# Patient Record
Sex: Male | Born: 1975 | Race: White | Hispanic: No | Marital: Married | Smoking: Never smoker
Health system: Southern US, Community
[De-identification: ages and names within clinical notes are randomized; demographics above are authoritative.]

## PROBLEM LIST (undated history)

## (undated) DIAGNOSIS — E785 Hyperlipidemia, unspecified: Secondary | ICD-10-CM

---

## 2009-05-28 ENCOUNTER — Ambulatory Visit (HOSPITAL_BASED_OUTPATIENT_CLINIC_OR_DEPARTMENT_OTHER): Admission: RE | Admit: 2009-05-28 | Discharge: 2009-05-28 | Payer: Self-pay | Admitting: Family Medicine

## 2009-05-28 ENCOUNTER — Ambulatory Visit: Payer: Self-pay | Admitting: Diagnostic Radiology

## 2009-05-28 ENCOUNTER — Ambulatory Visit: Payer: Self-pay | Admitting: Family Medicine

## 2009-05-28 DIAGNOSIS — E785 Hyperlipidemia, unspecified: Secondary | ICD-10-CM

## 2009-05-28 DIAGNOSIS — N209 Urinary calculus, unspecified: Secondary | ICD-10-CM

## 2009-05-28 DIAGNOSIS — R319 Hematuria, unspecified: Secondary | ICD-10-CM

## 2009-05-28 LAB — CONVERTED CEMR LAB
Bilirubin Urine: NEGATIVE
Glucose, Urine, Semiquant: NEGATIVE

## 2009-05-29 ENCOUNTER — Encounter: Payer: Self-pay | Admitting: Family Medicine

## 2009-08-25 ENCOUNTER — Ambulatory Visit: Payer: Self-pay | Admitting: Occupational Medicine

## 2010-09-01 NOTE — Assessment & Plan Note (Signed)
Summary: BACK PAIN,KIDNEY STONE/TJ   Vital Signs:  Patient Profile:   35 Years Old Male CC:      Back Pain Height:     71 inches Weight:      276 pounds O2 Sat:      100 % O2 treatment:    Room Air Temp:     98.1 degrees F oral Pulse rate:   84 / minute Pulse rhythm:   regular Resp:     16 per minute BP sitting:   142 / 90  (right arm) Cuff size:   regular  Vitals Entered By: Areta Haber CMA (August 25, 2009 4:39 PM)                  Current Allergies: ! * NIASPAN   History of Present Illness Chief Complaint: Back Pain History of Present Illness:   Dr. Alto Denver did not see or examine this patient.    REVIEW OF SYSTEMS       Musculoskeletal       Comments: low/middle back pain  Other Comments: Pt stated that he is he for followup and wanted another CT scan.  Original CT scan was done at [facility]-this does not do CT Scan after 5p.m.  Pt states that his Urologist advise 2 months ago that they would just wait and see if kidney stones passes. Pt states that he just wants another one done to see if everything is okay. Pt was advised  that he would need to follow up with his Urologist due to the CT Scan not been urgent, he was not in any pain, and he was specifically requesting to have this scan done.  Per the PASs, pt was questioning whether or not [facility] imaging was open as well. Pt also stated when he calld before he was advise to follow up with his PCP and/or Urologist due to [facility] being an Urgent care and not Primary care, he can not just walk in and request have a CT done. Pt was advise to follow up once again with his PC or Urologist.     The patient and/or caregiver has been counseled thoroughly with regard to medications prescribed including dosage, schedule, interactions, rationale for use, and possible side effects and they verbalize understanding.  Diagnoses and expected course of recovery discussed and will return if not improved as expected or  if the condition worsens. Patient and/or caregiver verbalized understanding.   Needs signing Donna Christen MD  August 26, 2009 3:05 PM

## 2011-03-06 IMAGING — CT CT ABDOMEN W/O CM
2 of 3 series · 16 of 46 positions shown, 18 images · non-contrast
Comparison: None

CT ABDOMEN

CLINICAL DATA: Low back pain, blood in urine for 2 days

CT ABDOMEN AND PELVIS WITHOUT CONTRAST
TECHNIQUE: Multidetector CT imaging of the abdomen and pelvis was
performed following the standard protocol without intravenous
contrast.

[Series 2: renal stone > 200 lbs 5.0 b31f · axial · 0.77mm/px · z∈[-594,-164]mm · 13 of 100 slices shown, 15 images]
[im 7/100  soft-tissue]
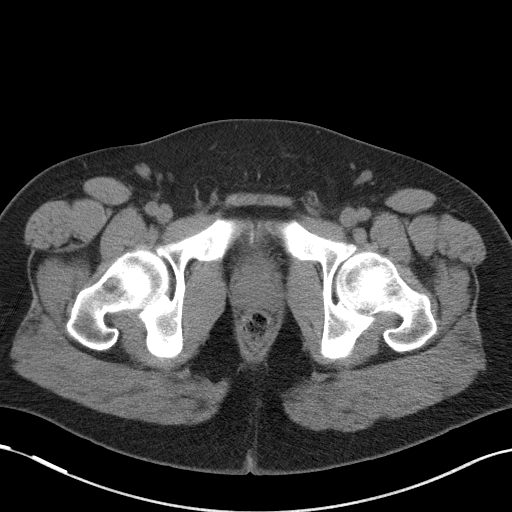
[im 7/100  bone]
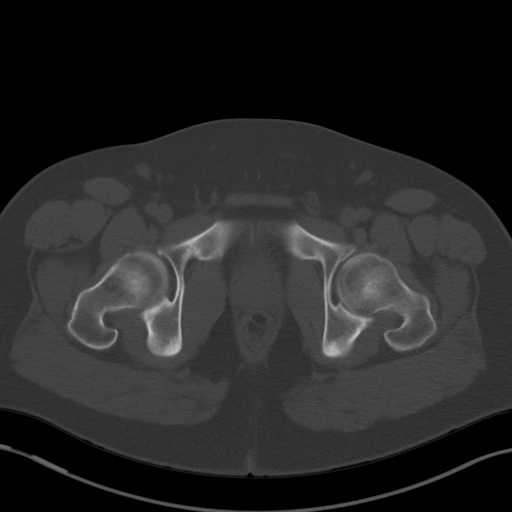
[im 13/100  soft-tissue]
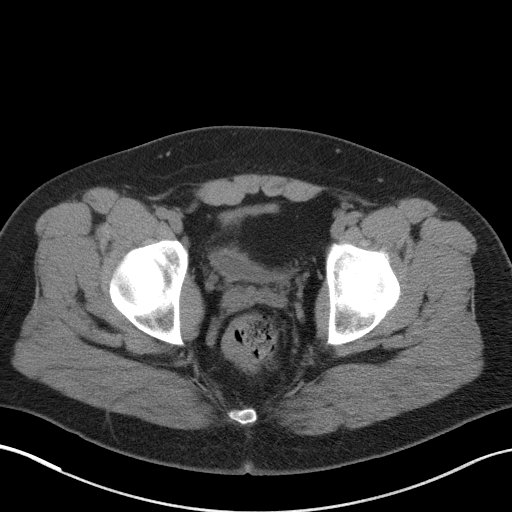
[im 20/100  soft-tissue]
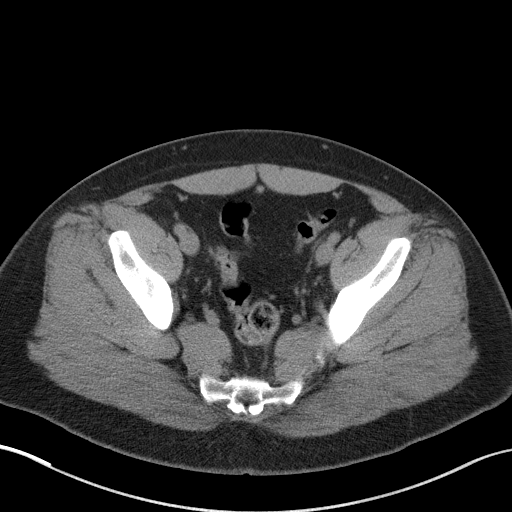
[im 29/100  soft-tissue]
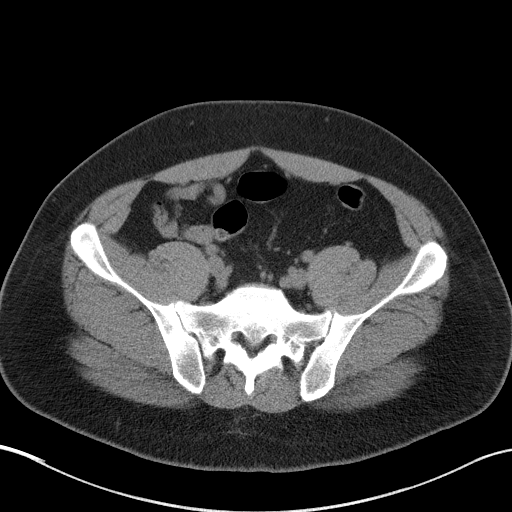
[im 36/100  soft-tissue]
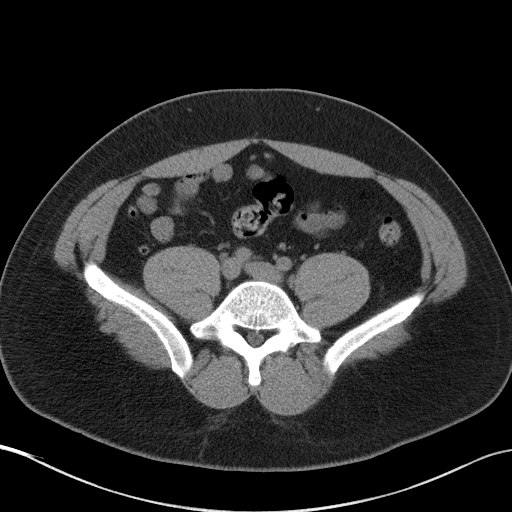
[im 42/100  soft-tissue]
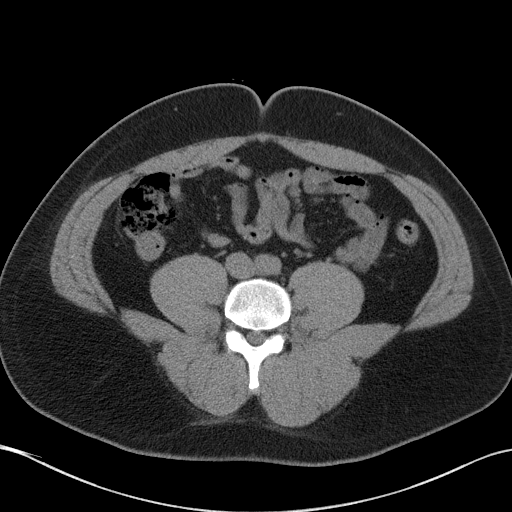
[im 52/100  soft-tissue]
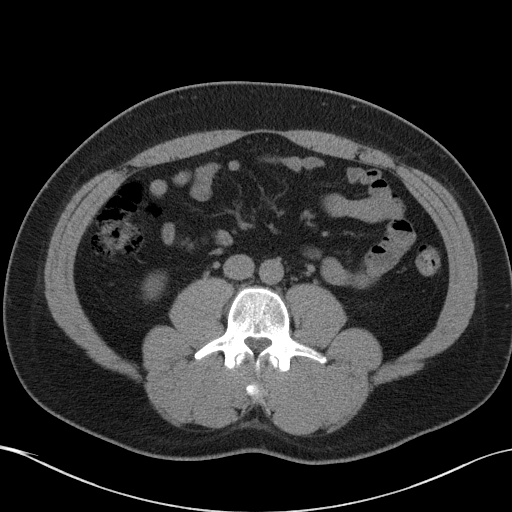
[im 58/100  soft-tissue]
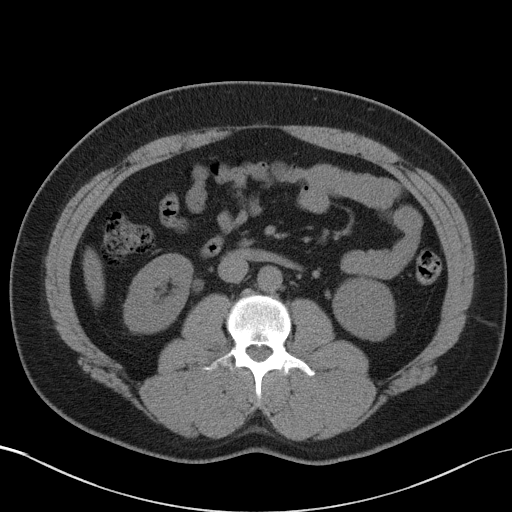
[im 64/100  soft-tissue]
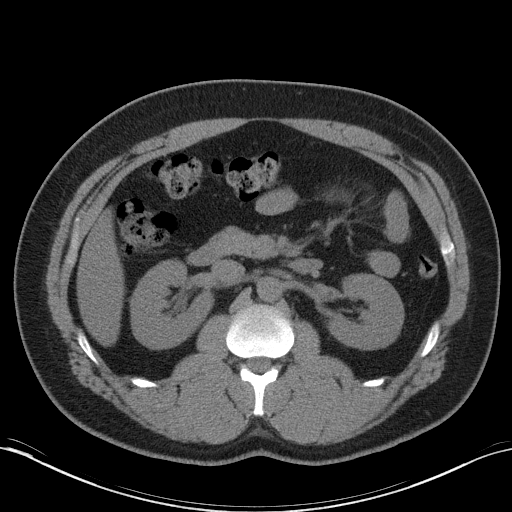
[im 64/100  bone]
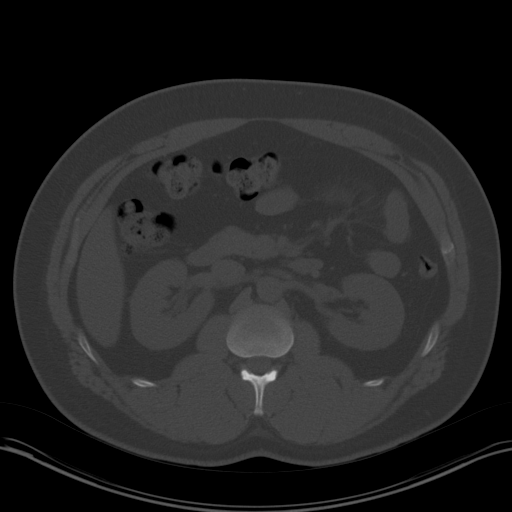
[im 71/100  soft-tissue]
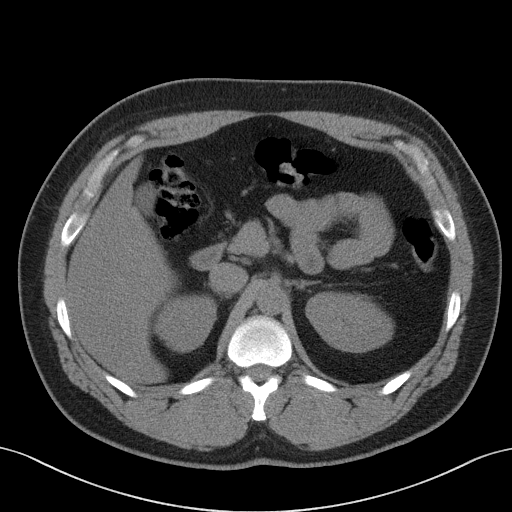
[im 80/100  soft-tissue]
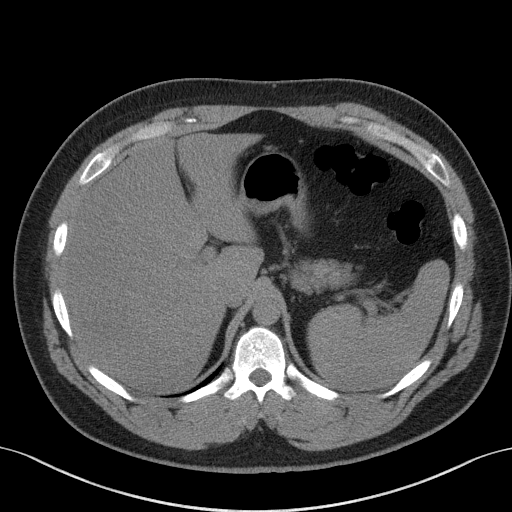
[im 87/100  soft-tissue]
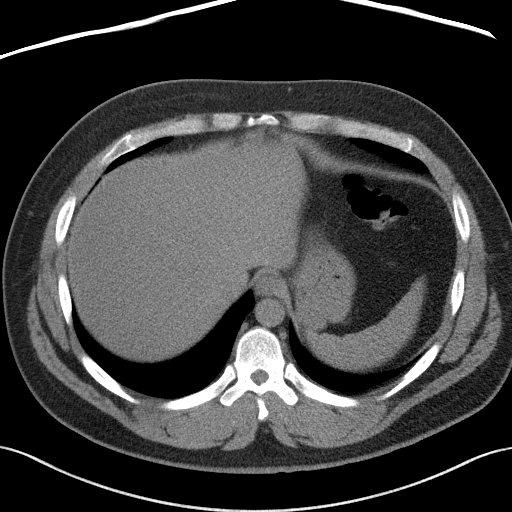
[im 93/100  soft-tissue]
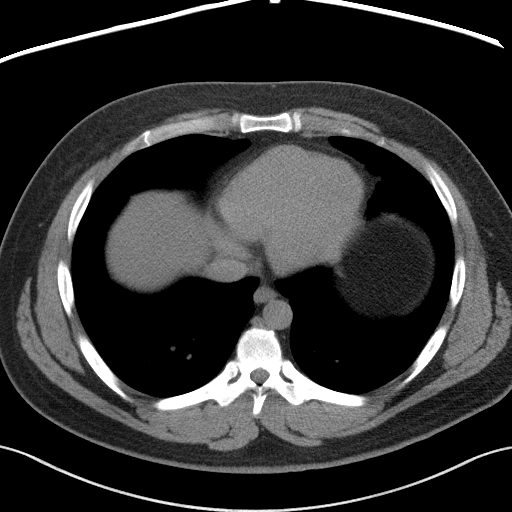

[Series 5: renal stone 3.0 coronal · coronal · 0.74mm/px · 3 of 96 slices shown]
[im 32/96  soft-tissue]
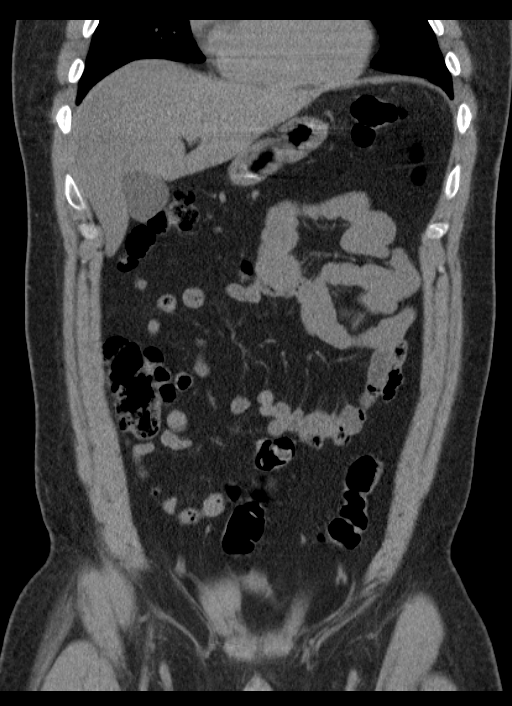
[im 43/96  soft-tissue]
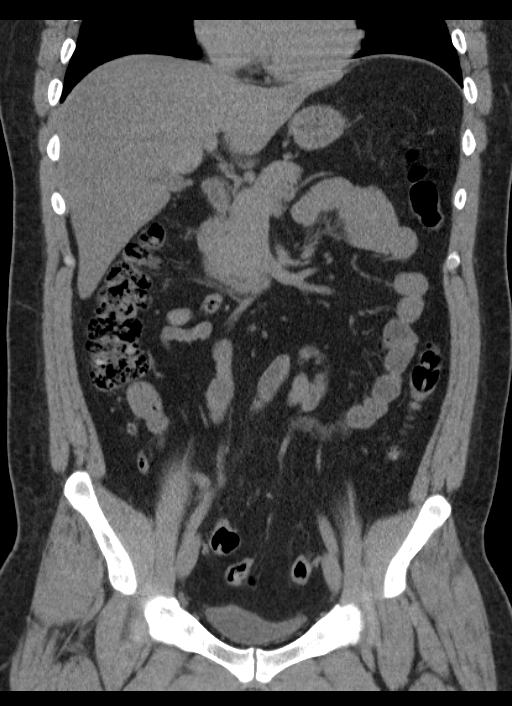
[im 53/96  soft-tissue]
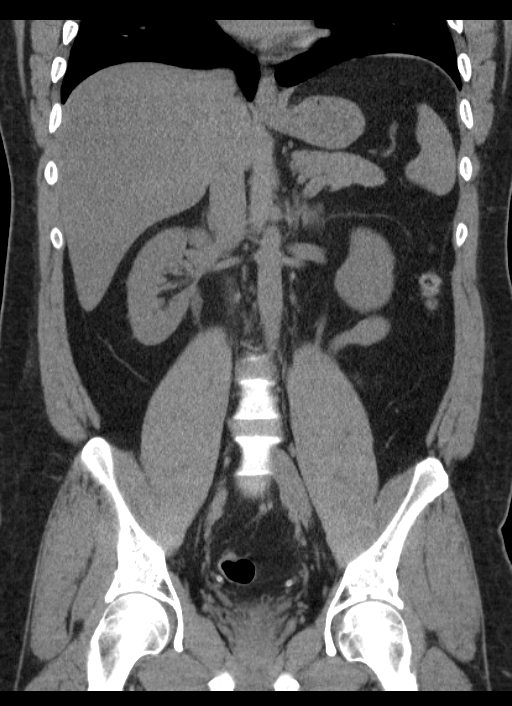

[16 of 46 positions shown; findings below may reference images not displayed]

FINDINGS: The lung bases are clear.  The liver appears grossly
normal in the unenhanced state although somewhat low in attenuation
suggesting mild fatty infiltration.  No calcified gallstones are
seen.  The pancreas is normal in size and the pancreatic duct is
not dilated.  The adrenal glands and spleen appear normal.  The
stomach is not well distended.  No definite renal calculi are seen.
However, there is a mild right hydronephrosis and proximal right
hydroureter to a point of obstruction by a 4 mm proximal right
ureteral calculus.  The left ureter is normal in caliber.
Abdominal aorta appears normal.  No adenopathy is seen.
IMPRESSION: Mild right hydronephrosis caused by 4 mm proximal right ureteral
calculus.  No other renal calculi are seen. Mild fatty infiltration
of the liver.

CT PELVIS
FINDINGS: The distal ureters are normal in caliber and no distal
ureteral calculi are seen.  The urinary bladder is unremarkable.
The prostate is normal in size.  No pelvic mass, fluid, or
adenopathy is seen.  The appendix is well visualized and appears
normal.  No bony abnormalities seen.
IMPRESSION: No significant abnormality on CT of the pelvis.  No distal ureteral
calculi are noted.

## 2014-05-10 ENCOUNTER — Emergency Department (INDEPENDENT_AMBULATORY_CARE_PROVIDER_SITE_OTHER)
Admission: EM | Admit: 2014-05-10 | Discharge: 2014-05-10 | Disposition: A | Discharge status: Home / Self Care | Payer: BC Managed Care – PPO | Source: Home / Self Care | Attending: Emergency Medicine | Admitting: Emergency Medicine

## 2014-05-10 ENCOUNTER — Encounter: Payer: Self-pay | Admitting: Emergency Medicine

## 2014-05-10 DIAGNOSIS — S39012A Strain of muscle, fascia and tendon of lower back, initial encounter: Secondary | ICD-10-CM

## 2014-05-10 HISTORY — DX: Hyperlipidemia, unspecified: E78.5

## 2014-05-10 MED ORDER — CYCLOBENZAPRINE HCL 5 MG PO TABS: ORAL_TABLET | ORAL | Status: AC

## 2014-05-10 MED ORDER — HYDROCODONE-ACETAMINOPHEN 5-325 MG PO TABS
1.0000 | ORAL_TABLET | ORAL | Status: AC | PRN
Start: 2014-05-10 — End: ?

## 2014-05-10 MED ORDER — PREDNISONE (PAK) 10 MG PO TABS: ORAL_TABLET | ORAL | Status: AC

## 2014-05-10 MED ORDER — IBUPROFEN 200 MG PO TABS: ORAL_TABLET | ORAL | Status: AC

## 2014-05-10 NOTE — ED Provider Notes (Signed)
CSN: [id number]     Arrival date & time 05/10/14  1328 History   First MD Initiated Contact with Patient 05/10/14 1408     Chief Complaint  Patient presents with  . Back Pain   (Consider location/radiation/quality/duration/timing/severity/associated sxs/prior Treatment) HPI Nicholas Riggs reports jumping up catching a football yesterday and felt pain in lower back. Today when moving furniture felt sudden sharp pain in same place, left lower back. Nicholas Riggs is constant and radiating down to left thigh but not beyond. Pain is sharp, 8/10. Has not tried any medication for this. No incontinence or numbness or focal weakness. He had a similar low back strain 3 years ago that resolved with muscle relaxants and other medicine, this resolved without sequelae. No chest pain or shortness of breath Past Medical History  Diagnosis Date  . Hyperlipidemia    History reviewed. No pertinent past surgical history. Family History  Problem Relation Age of Onset  . Hyperlipidemia Father    History  Substance Use Topics  . Smoking status: Never Smoker   . Smokeless tobacco: Never Used  . Alcohol Use: No    Review of Systems  All other systems reviewed and are negative.   Allergies  Niacin  Home Medications   Prior to Admission medications   Medication Sig Start Date End Date Taking? Authorizing Provider  Ezetimibe (ZETIA PO) Take by mouth.   Yes Historical Provider, MD  Fenofibrate (TRICOR PO) Take by mouth.   Yes Historical Provider, MD  Rosuvastatin Calcium (CRESTOR PO) Take by mouth.   Yes Historical Provider, MD  cyclobenzaprine (FLEXERIL) 5 MG tablet Take 1 or 2 every 8 hours as needed for muscle relaxant. Caution: May cause drowsiness. 05/10/14   Jacqulyn Cane, MD  HYDROcodone-acetaminophen (NORCO/VICODIN) 5-325 MG per tablet Take 1-2 tablets by mouth every 4 (four) hours as needed for severe pain. Take with food. 05/10/14   Jacqulyn Cane, MD  ibuprofen (ADVIL,MOTRIN) 200 MG tablet Take three tablets (  600 milligrams total) every 6 with food as needed for mild-moderate pain.--Over-the-counter 05/10/14   Jacqulyn Cane, MD  predniSONE (STERAPRED UNI-PAK) 10 MG tablet Take as directed for 6 days.--Take 6 on day 1, 5 on day 2, 4 on day 3, then 3 tablets on day 4, then 2 tablets on day 5, then 1 on day 6. 05/10/14   Jacqulyn Cane, MD   BP 122/86  Pulse 72  Resp 18  Ht 5' 11"  (1.803 m)  Wt 286 lb (129.729 kg)  BMI 39.91 kg/m2  SpO2 98% Physical Exam  Nursing note and vitals reviewed. Constitutional: He is oriented to person, place, and time. He appears well-developed and well-nourished. He is cooperative.  Non-toxic appearance. He appears distressed (Appears mildly uncomfortable from low back pain.).  HENT:  Head: Normocephalic and atraumatic.  Mouth/Throat: Oropharynx is clear and moist.  Eyes: EOM are normal. Pupils are equal, round, and reactive to light. No scleral icterus.  Neck: Neck supple.  Cardiovascular: Regular rhythm and normal heart sounds.   Pulmonary/Chest: Effort normal and breath sounds normal. No respiratory distress. He has no wheezes. He has no rales. He exhibits no tenderness.  Abdominal: Soft. There is no tenderness.  Musculoskeletal:       Right hip: Normal.       Left hip: Normal.       Cervical back: He exhibits no tenderness.       Thoracic back: He exhibits no tenderness.       Lumbar back: He exhibits decreased range of  motion, tenderness and spasm. He exhibits no bony tenderness, no swelling, no edema, no deformity, no laceration and normal pulse.  Negative Right straight leg-raise test. Negative Left straight leg-raise test.  Negative Right Saralyn Pilar test. Negative Left Saralyn Pilar test.    Neurological: He is alert and oriented to person, place, and time. He has normal strength. He displays no atrophy, no tremor and normal reflexes. No cranial nerve deficit or sensory deficit. He exhibits normal muscle tone. Gait normal.  Reflex Scores:      Patellar reflexes are  2+ on the right side and 2+ on the left side.      Achilles reflexes are 2+ on the right side and 2+ on the left side. Skin: Skin is warm, dry and intact. No lesion and no rash noted.  Psychiatric: He has a normal mood and affect.   Mildly tender left sciatic notch ED Course  Procedures (including critical care time) Labs Review Labs Reviewed - No data to display  Imaging Review No results found.  He declined any imaging MDM   1. Lumbar strain, initial encounter    may also have mild left sciatica but neurologic exam intact. Discogenic cause unlikely. Straight leg raise negative No focal neurologic deficit  Treatment options discussed, as well as risks, benefits, alternatives. Patient voiced understanding and agreement with the following plans: Flexeril for muscle relaxant Ibuprofen for mild or moderate pain Small prescription for Vicodin if needed for severe pain Oral prednisone burst x6 days  Follow-up with your primary care doctor or orthopedist in 5-7 days if not improving, or sooner if symptoms become worse. Precautions discussed. Red flags discussed. Questions invited and answered. Patient voiced understanding and agreement.     Jacqulyn Cane, MD 05/14/14 1059

## 2014-05-10 NOTE — ED Notes (Signed)
Octavio reports jumping up catching a football yesterday and felt pain in lower back. Today when moving furniture felt sudden sharp pain in same place, left lower back. Justus Memory is constant and radiating down left leg.
